# Patient Record
Sex: Male | Born: 1995 | Race: White | Hispanic: No | Marital: Single | Smoking: Never smoker
Health system: Southern US, Community
[De-identification: ages and names within clinical notes are randomized; demographics above are authoritative.]

## PROBLEM LIST (undated history)

## (undated) DIAGNOSIS — L709 Acne, unspecified: Secondary | ICD-10-CM

## (undated) DIAGNOSIS — T7840XA Allergy, unspecified, initial encounter: Secondary | ICD-10-CM

## (undated) HISTORY — DX: Acne, unspecified: L70.9

## (undated) HISTORY — DX: Allergy, unspecified, initial encounter: T78.40XA

---
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
## 2014-04-24 ENCOUNTER — Ambulatory Visit (INDEPENDENT_AMBULATORY_CARE_PROVIDER_SITE_OTHER): Payer: BC Managed Care – PPO | Admitting: *Deleted

## 2014-04-24 DIAGNOSIS — Z23 Encounter for immunization: Secondary | ICD-10-CM | POA: Diagnosis not present

## 2014-05-26 IMAGING — CT CT WRIST*L* W/O CM
3 of 5 series · 16 of 35 positions shown, 19 images · non-contrast
Comparison: Plain films left wrist 03/13/2012 and CT scan
07/30/2012.

CLINICAL DATA: History of scaphoid fracture.  Evaluate healing.

EXAM:
CT OF THE LEFT WRIST WITHOUT CONTRAST
TECHNIQUE: Multidetector CT imaging was performed according to the standard
protocol. Multiplanar CT image reconstructions were also generated.

[Series 5: upper ext soft · axial · 0.40mm/px · z∈[-181,-8]mm · 9 of 83 slices shown, 12 images]
[im 7/83  soft-tissue]
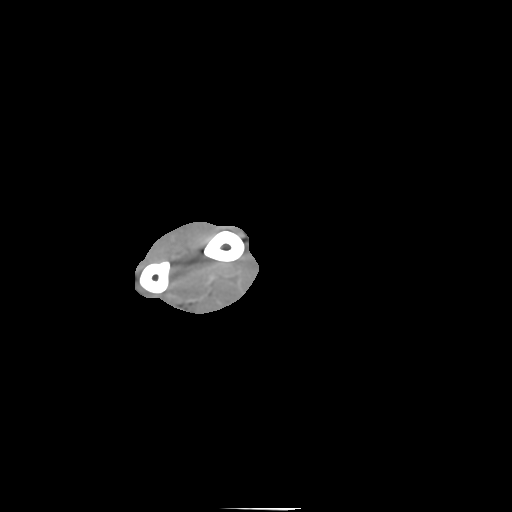
[im 7/83  bone]
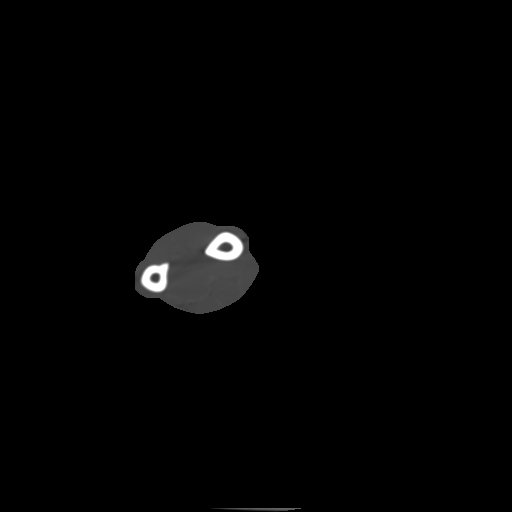
[im 19/83  bone]
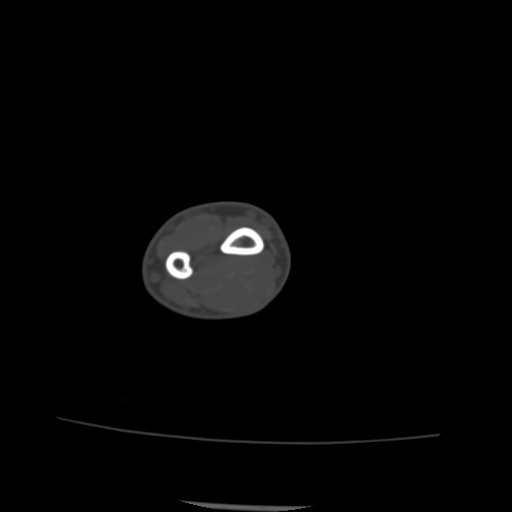
[im 26/83  bone]
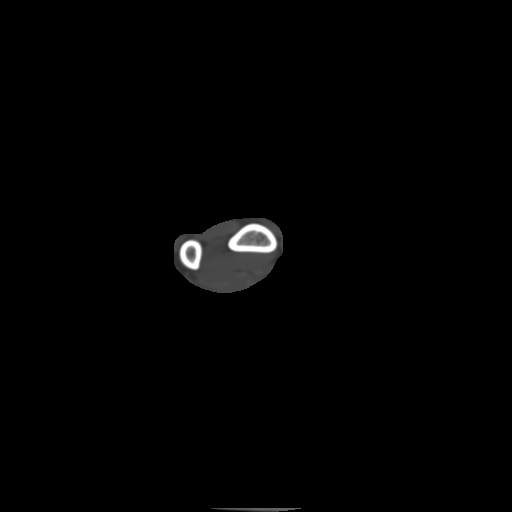
[im 32/83  bone]
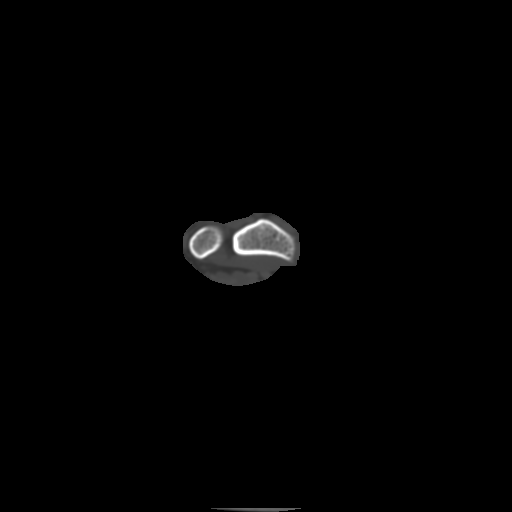
[im 45/83  soft-tissue]
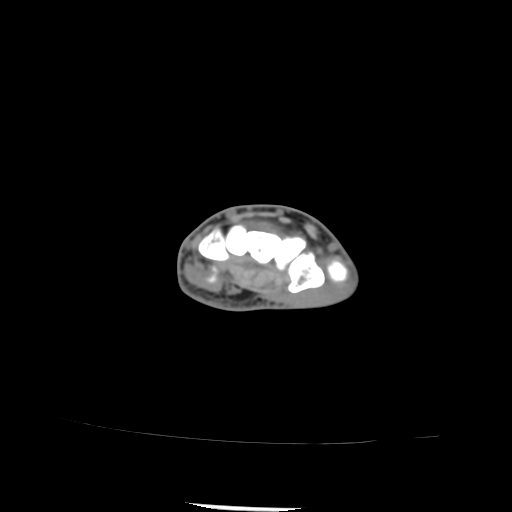
[im 45/83  bone]
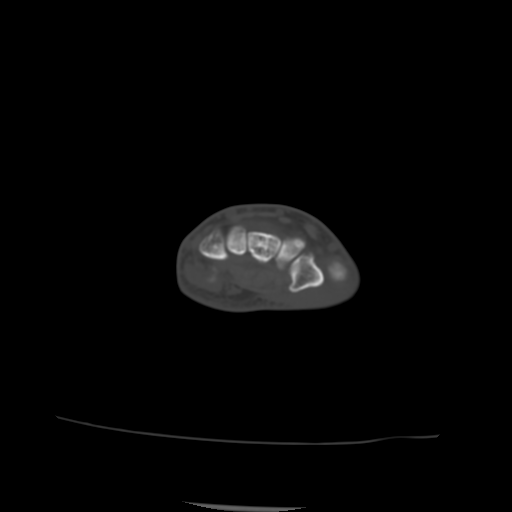
[im 51/83  bone]
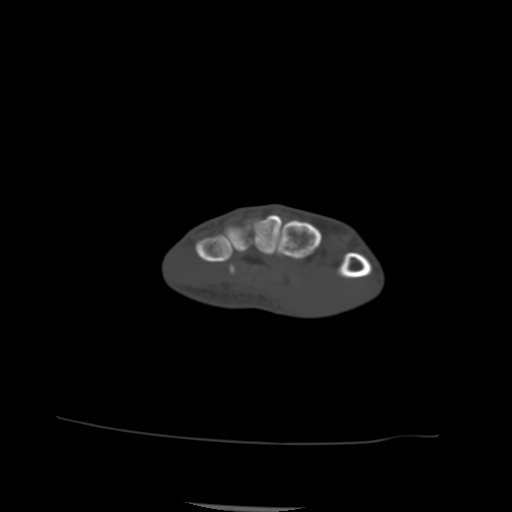
[im 57/83  bone]
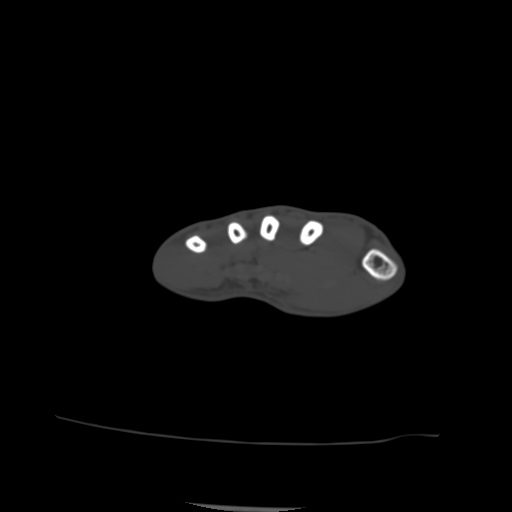
[im 70/83  bone]
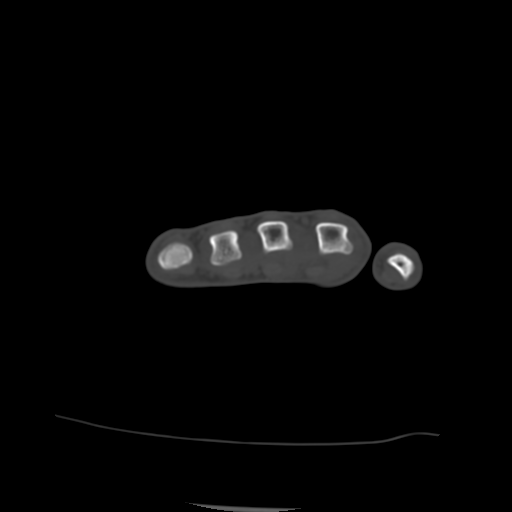
[im 76/83  soft-tissue]
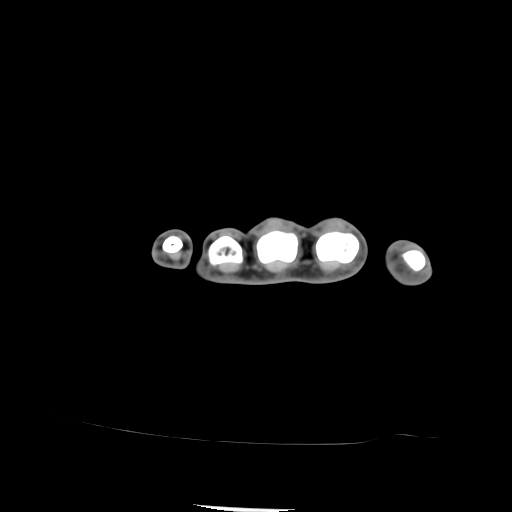
[im 76/83  bone]
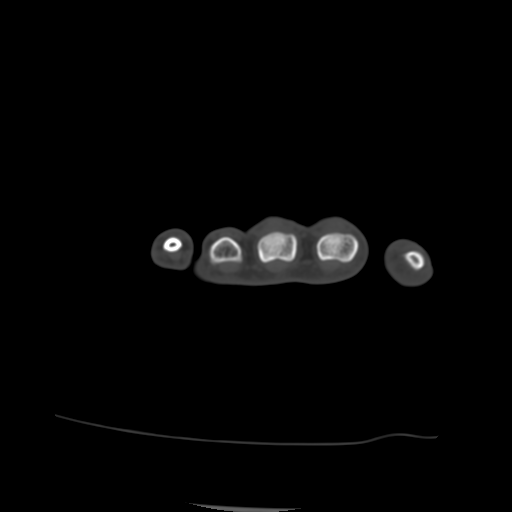

[Series 202: sag bone · sagittal · 0.35mm/px · 6 of 61 slices shown]
[im 11/61  bone]
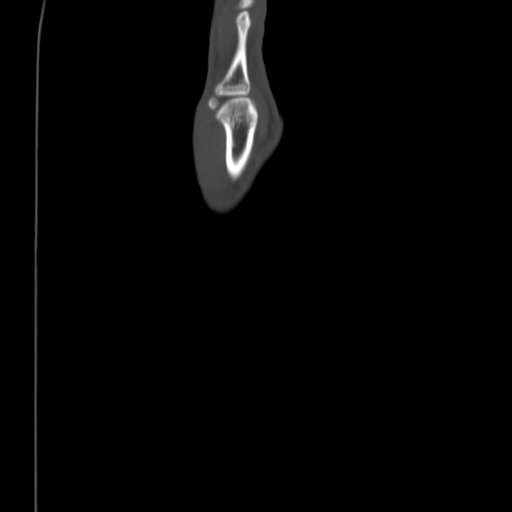
[im 21/61  bone]
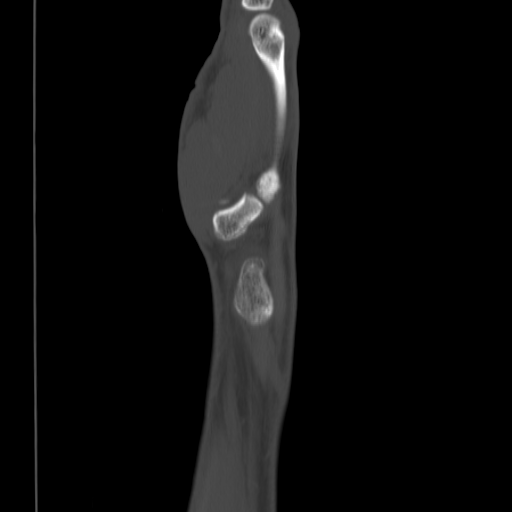
[im 31/61  bone]
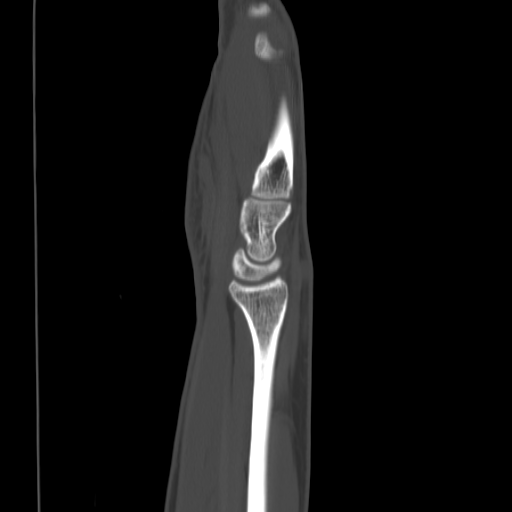
[im 35/61  soft-tissue]
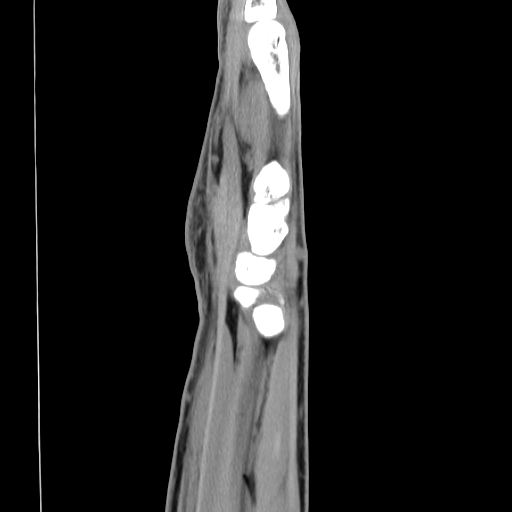
[im 41/61  bone]
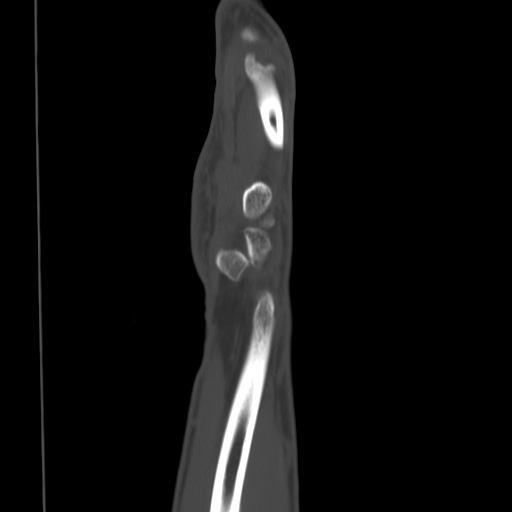
[im 51/61  bone]
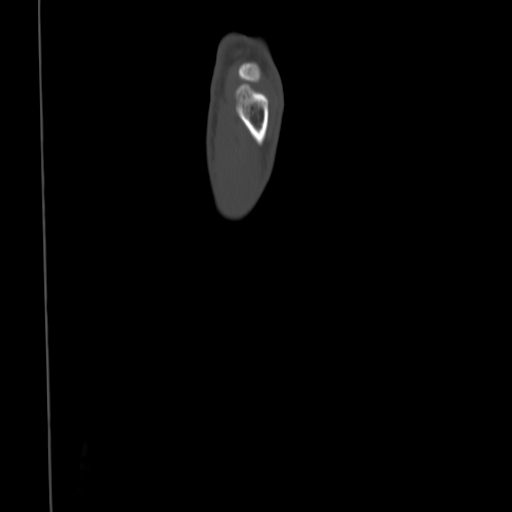

[Series 203: cor bone · coronal · 0.35mm/px · 1 of 61 slices shown]
[im 31/61  bone]
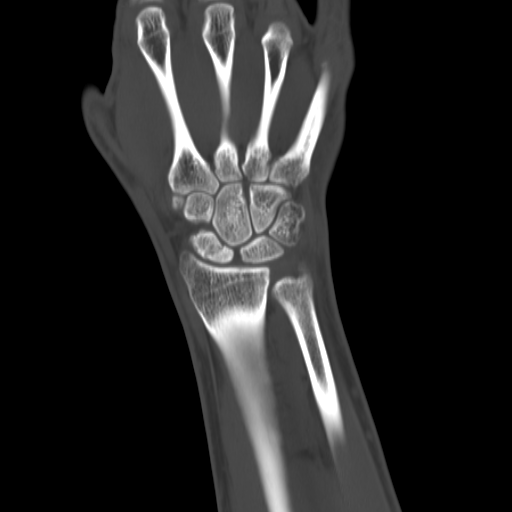

[16 of 35 positions shown; findings below may reference images not displayed]

FINDINGS: There has been progressive healing of the patient's scaphoid
fracture with bridging bone seen across the fracture lines centrally
and dorsally. Small area corticated bone is seen along the volar
margin of the fracture consistent with a small area of nonunion.
There is some sclerosis of the proximal pole of the scaphoid. Image
bones otherwise appear normal. Ulnar minus variance is noted.
IMPRESSION: Scaphoid waist fracture shows progressive healing with bridging bone
identified. There is a small area nonunion along the volar margin of
the fracture. There is some sclerosis of the proximal pole of
scaphoid which could be due to avascular necrosis but can also be a
normal finding in scaphoid in fractures.
